# Patient Record
Sex: Female | Born: 1990 | Race: White | Hispanic: No | Marital: Single | Smoking: Current every day smoker
Health system: Southern US, Community
[De-identification: ages and names within clinical notes are randomized; demographics above are authoritative.]

## PROBLEM LIST (undated history)

## (undated) DIAGNOSIS — N390 Urinary tract infection, site not specified: Secondary | ICD-10-CM

## (undated) HISTORY — PX: OTHER SURGICAL HISTORY: SHX169

## (undated) HISTORY — PX: CYST REMOVAL HAND: SHX6279

## (undated) HISTORY — PX: TONSILLECTOMY: SUR1361

## (undated) HISTORY — PX: TYMPANOSTOMY TUBE PLACEMENT: SHX32

---

## 2014-01-02 ENCOUNTER — Encounter (HOSPITAL_BASED_OUTPATIENT_CLINIC_OR_DEPARTMENT_OTHER): Payer: Self-pay | Admitting: Emergency Medicine

## 2014-01-02 ENCOUNTER — Emergency Department (HOSPITAL_BASED_OUTPATIENT_CLINIC_OR_DEPARTMENT_OTHER)
Admission: EM | Admit: 2014-01-02 | Discharge: 2014-01-03 | Disposition: A | Discharge status: Home / Self Care | Payer: Medicaid Other | Attending: Emergency Medicine | Admitting: Emergency Medicine

## 2014-01-02 DIAGNOSIS — K5900 Constipation, unspecified: Secondary | ICD-10-CM | POA: Insufficient documentation

## 2014-01-02 DIAGNOSIS — R142 Eructation: Secondary | ICD-10-CM

## 2014-01-02 DIAGNOSIS — Z79899 Other long term (current) drug therapy: Secondary | ICD-10-CM | POA: Insufficient documentation

## 2014-01-02 DIAGNOSIS — IMO0001 Reserved for inherently not codable concepts without codable children: Secondary | ICD-10-CM

## 2014-01-02 DIAGNOSIS — O99330 Smoking (tobacco) complicating pregnancy, unspecified trimester: Secondary | ICD-10-CM | POA: Insufficient documentation

## 2014-01-02 DIAGNOSIS — O2390 Unspecified genitourinary tract infection in pregnancy, unspecified trimester: Secondary | ICD-10-CM | POA: Insufficient documentation

## 2014-01-02 DIAGNOSIS — R141 Gas pain: Secondary | ICD-10-CM | POA: Insufficient documentation

## 2014-01-02 DIAGNOSIS — N390 Urinary tract infection, site not specified: Secondary | ICD-10-CM | POA: Insufficient documentation

## 2014-01-02 DIAGNOSIS — R143 Flatulence: Secondary | ICD-10-CM

## 2014-01-02 NOTE — ED Notes (Signed)
Pt reports that she developed mid abdominal pain from umbilical pain down yesterday, sharp shooting pains that come and go, pmh of constipation

## 2014-01-03 ENCOUNTER — Encounter (HOSPITAL_BASED_OUTPATIENT_CLINIC_OR_DEPARTMENT_OTHER): Payer: Self-pay | Admitting: Emergency Medicine

## 2014-01-03 ENCOUNTER — Emergency Department (HOSPITAL_BASED_OUTPATIENT_CLINIC_OR_DEPARTMENT_OTHER): Payer: Medicaid Other

## 2014-01-03 LAB — URINALYSIS, ROUTINE W REFLEX MICROSCOPIC
Bilirubin Urine: NEGATIVE
GLUCOSE, UA: NEGATIVE mg/dL
Hgb urine dipstick: NEGATIVE
Ketones, ur: NEGATIVE mg/dL
Nitrite: NEGATIVE
PH: 5.5 (ref 5.0–8.0)
Protein, ur: NEGATIVE mg/dL
Specific Gravity, Urine: 1.028 (ref 1.005–1.030)
Urobilinogen, UA: 0.2 mg/dL (ref 0.0–1.0)

## 2014-01-03 LAB — URINE MICROSCOPIC-ADD ON

## 2014-01-03 MED ORDER — NITROFURANTOIN MONOHYD MACRO 100 MG PO CAPS
100.0000 mg | ORAL_CAPSULE | Freq: Once | ORAL | Status: AC
  Administered 2014-01-03: 100 mg via ORAL
  Filled 2014-01-03: qty 1

## 2014-01-03 MED ORDER — NITROFURANTOIN MONOHYD MACRO 100 MG PO CAPS
100.0000 mg | ORAL_CAPSULE | Freq: Two times a day (BID) | ORAL | Status: DC
Start: 2014-01-03 — End: 2014-06-10

## 2014-01-03 NOTE — ED Notes (Signed)
Patient transported to Ultrasound 

## 2014-01-03 NOTE — ED Notes (Signed)
abd pain w nausea x 2 days  Denies vag dc

## 2014-01-03 NOTE — ED Provider Notes (Signed)
CSN: [id number]     Arrival date & time 01/02/14  2341 History   First MD Initiated Contact with Patient 01/03/14 0030     Chief Complaint  Patient presents with  . Abdominal Pain     (Consider location/radiation/quality/duration/timing/severity/associated sxs/prior Treatment) Patient is a 23 y.o. female presenting with abdominal pain. The history is provided by the patient.  Abdominal Pain Pain location:  Generalized Pain quality: cramping   Pain radiates to:  Does not radiate Pain severity:  Moderate Onset quality:  Gradual Timing:  Constant Progression:  Unchanged Chronicity:  Recurrent Context: not alcohol use   Relieved by:  Nothing Worsened by:  Nothing tried Ineffective treatments:  None tried Associated symptoms: constipation and dysuria   Associated symptoms: no diarrhea, no fever, no nausea, no vaginal bleeding, no vaginal discharge and no vomiting   Risk factors: pregnancy     History reviewed. No pertinent past medical history. History reviewed. No pertinent past surgical history. History reviewed. No pertinent family history. History  Substance Use Topics  . Smoking status: Current Every Day Smoker -- 0.50 packs/day    Types: Cigarettes  . Smokeless tobacco: Not on file  . Alcohol Use: No   OB History   Grav Para Term Preterm Abortions TAB SAB Ect Mult Living   1              Review of Systems  Constitutional: Negative for fever.  Gastrointestinal: Positive for abdominal pain and constipation. Negative for nausea, vomiting and diarrhea.  Genitourinary: Positive for dysuria. Negative for flank pain, vaginal bleeding and vaginal discharge.  All other systems reviewed and are negative.     Allergies  Review of patient's allergies indicates no known allergies.  Home Medications   Prior to Admission medications   Medication Sig Start Date End Date Taking? Authorizing Provider  Prenatal Vit-Fe Fumarate-FA (PRENATAL MULTIVITAMIN) TABS tablet Take 1  tablet by mouth daily at 12 noon.   Yes Historical Provider, MD  nitrofurantoin, macrocrystal-monohydrate, (MACROBID) 100 MG capsule Take 1 capsule (100 mg total) by mouth 2 (two) times daily. X 7 days 01/03/14   April K Palumbo-Rasch, MD   BP 114/85  Pulse 82  Temp(Src) 97.9 F (36.6 C) (Oral)  Resp 18  Ht 5\' 5"  (1.651 m)  Wt 117 lb (53.071 kg)  BMI 19.47 kg/m2  SpO2 100%  LMP 11/14/2013 Physical Exam  Constitutional: She is oriented to person, place, and time. She appears well-developed and well-nourished. No distress.  HENT:  Head: Normocephalic and atraumatic.  Mouth/Throat: Oropharynx is clear and moist.  Eyes: Conjunctivae are normal. Pupils are equal, round, and reactive to light.  Neck: Normal range of motion. Neck supple.  Cardiovascular: Normal rate, regular rhythm and intact distal pulses.   Pulmonary/Chest: Effort normal. She has no wheezes. She has no rales.  Abdominal: Soft. Bowel sounds are increased. There is no tenderness. There is no rebound and no guarding.  Gas and patient points to spots where gas is heard as source of pain  Musculoskeletal: Normal range of motion.  Neurological: She is alert and oriented to person, place, and time.  Skin: Skin is warm and dry.  Psychiatric: She has a normal mood and affect.    ED Course  Procedures (including critical care time) Labs Review Labs Reviewed  URINALYSIS, ROUTINE W REFLEX MICROSCOPIC - Abnormal; Notable for the following:    APPearance CLOUDY (*)    Leukocytes, UA SMALL (*)    All other components within normal limits  URINE MICROSCOPIC-ADD ON - Abnormal; Notable for the following:    Squamous Epithelial / LPF FEW (*)    Bacteria, UA FEW (*)    All other components within normal limits    Imaging Review [place] Ob Comp Less 14 Wks  01/03/2014   CLINICAL DATA:  Pelvic pain and nausea with positive pregnancy test  EXAM: OBSTETRIC <14 WK US AND TRANSVAGINAL OB US  TECHNIQUE: Both transabdominal and transvaginal  ultrasound examinations were performed for complete evaluation of the gestation as well as the maternal uterus, adnexal regions, and pelvic cul-de-sac. Transvaginal technique was performed to assess early pregnancy.  COMPARISON:  None.  FINDINGS: Intrauterine gestational sac: Visualized/normal in shape.  Yolk sac:  Present  Embryo:  Present  Cardiac Activity: Present  Heart Rate:  123 bpm  CRL:   7.2  mm   6 w 4 d                  US EDC: 08/25/2014  Maternal uterus/adnexae: No definitive subchorionic hematoma. When accounting for a corpus luteum on the right, the ovaries are symmetric in size and unremarkable in appearance. No free pelvic fluid or adnexal mass.  IMPRESSION: Single living intrauterine gestation, sonographic age 58 weeks 4 days. No adverse fetal or maternal findings.   Electronically Signed   By: Tiburcio PeaJonathan  Watts M.D.   On: 01/03/2014 00:37   [place] Ob Transvaginal  01/03/2014   CLINICAL DATA:  Pelvic pain and nausea with positive pregnancy test  EXAM: OBSTETRIC <14 WK US AND TRANSVAGINAL OB US  TECHNIQUE: Both transabdominal and transvaginal ultrasound examinations were performed for complete evaluation of the gestation as well as the maternal uterus, adnexal regions, and pelvic cul-de-sac. Transvaginal technique was performed to assess early pregnancy.  COMPARISON:  None.  FINDINGS: Intrauterine gestational sac: Visualized/normal in shape.  Yolk sac:  Present  Embryo:  Present  Cardiac Activity: Present  Heart Rate:  123 bpm  CRL:   7.2  mm   6 w 4 d                  US EDC: 08/25/2014  Maternal uterus/adnexae: No definitive subchorionic hematoma. When accounting for a corpus luteum on the right, the ovaries are symmetric in size and unremarkable in appearance. No free pelvic fluid or adnexal mass.  IMPRESSION: Single living intrauterine gestation, sonographic age 58 weeks 4 days. No adverse fetal or maternal findings.   Electronically Signed   By: Tiburcio PeaJonathan  Watts M.D.   On: 01/03/2014 00:37     EKG  Interpretation None      MDM   Final diagnoses:  UTI (lower urinary tract infection)  Gas    Will treat for UTI.  Call your OB to see if gas ex is on the approved pregnancy list.  Recommend high fiber low grease diet for cramping.  No indication for advanced imaging exam and vitals are benign and reassuring     April K Palumbo-Rasch, MD 01/03/14 [id number]

## 2014-05-12 ENCOUNTER — Encounter (HOSPITAL_BASED_OUTPATIENT_CLINIC_OR_DEPARTMENT_OTHER): Payer: Self-pay | Admitting: Emergency Medicine

## 2014-05-13 ENCOUNTER — Emergency Department (HOSPITAL_BASED_OUTPATIENT_CLINIC_OR_DEPARTMENT_OTHER)
Admission: EM | Admit: 2014-05-13 | Discharge: 2014-05-13 | Disposition: A | Discharge status: Home / Self Care | Payer: Medicaid Other | Attending: Emergency Medicine | Admitting: Emergency Medicine

## 2014-05-13 ENCOUNTER — Encounter (HOSPITAL_BASED_OUTPATIENT_CLINIC_OR_DEPARTMENT_OTHER): Payer: Self-pay

## 2014-05-13 DIAGNOSIS — Y9389 Activity, other specified: Secondary | ICD-10-CM | POA: Diagnosis not present

## 2014-05-13 DIAGNOSIS — M545 Low back pain: Secondary | ICD-10-CM | POA: Diagnosis present

## 2014-05-13 DIAGNOSIS — Z720 Tobacco use: Secondary | ICD-10-CM | POA: Diagnosis not present

## 2014-05-13 DIAGNOSIS — Z791 Long term (current) use of non-steroidal anti-inflammatories (NSAID): Secondary | ICD-10-CM | POA: Diagnosis not present

## 2014-05-13 DIAGNOSIS — Z79899 Other long term (current) drug therapy: Secondary | ICD-10-CM | POA: Insufficient documentation

## 2014-05-13 DIAGNOSIS — S39012A Strain of muscle, fascia and tendon of lower back, initial encounter: Secondary | ICD-10-CM | POA: Insufficient documentation

## 2014-05-13 DIAGNOSIS — Y92410 Unspecified street and highway as the place of occurrence of the external cause: Secondary | ICD-10-CM | POA: Insufficient documentation

## 2014-05-13 MED ORDER — NAPROXEN 500 MG PO TABS: 500.0000 mg | ORAL_TABLET | Freq: Two times a day (BID) | ORAL | Status: DC

## 2014-05-13 MED ORDER — CYCLOBENZAPRINE HCL 10 MG PO TABS: 10.0000 mg | ORAL_TABLET | Freq: Two times a day (BID) | ORAL | Status: DC | PRN

## 2014-05-13 MED ORDER — OXYCODONE-ACETAMINOPHEN 5-325 MG PO TABS: 1.0000 | ORAL_TABLET | Freq: Four times a day (QID) | ORAL | Status: DC | PRN
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
## 2014-06-10 ENCOUNTER — Emergency Department (HOSPITAL_BASED_OUTPATIENT_CLINIC_OR_DEPARTMENT_OTHER)
Admission: EM | Admit: 2014-06-10 | Discharge: 2014-06-10 | Disposition: A | Discharge status: Home / Self Care | Payer: Medicaid Other | Attending: Emergency Medicine | Admitting: Emergency Medicine

## 2014-06-10 ENCOUNTER — Encounter (HOSPITAL_BASED_OUTPATIENT_CLINIC_OR_DEPARTMENT_OTHER): Payer: Self-pay

## 2014-06-10 DIAGNOSIS — R300 Dysuria: Secondary | ICD-10-CM

## 2014-06-10 DIAGNOSIS — N760 Acute vaginitis: Secondary | ICD-10-CM | POA: Diagnosis not present

## 2014-06-10 DIAGNOSIS — Z3202 Encounter for pregnancy test, result negative: Secondary | ICD-10-CM | POA: Insufficient documentation

## 2014-06-10 DIAGNOSIS — Z720 Tobacco use: Secondary | ICD-10-CM | POA: Insufficient documentation

## 2014-06-10 DIAGNOSIS — Z792 Long term (current) use of antibiotics: Secondary | ICD-10-CM | POA: Insufficient documentation

## 2014-06-10 DIAGNOSIS — N390 Urinary tract infection, site not specified: Secondary | ICD-10-CM | POA: Insufficient documentation

## 2014-06-10 DIAGNOSIS — B9689 Other specified bacterial agents as the cause of diseases classified elsewhere: Secondary | ICD-10-CM

## 2014-06-10 LAB — URINALYSIS, ROUTINE W REFLEX MICROSCOPIC
BILIRUBIN URINE: NEGATIVE
Glucose, UA: NEGATIVE mg/dL
Hgb urine dipstick: NEGATIVE
KETONES UR: NEGATIVE mg/dL
NITRITE: POSITIVE — AB
PH: 6 (ref 5.0–8.0)
PROTEIN: NEGATIVE mg/dL
Specific Gravity, Urine: 1.016 (ref 1.005–1.030)
Urobilinogen, UA: 0.2 mg/dL (ref 0.0–1.0)

## 2014-06-10 LAB — WET PREP, GENITAL
Trich, Wet Prep: NONE SEEN
YEAST WET PREP: NONE SEEN

## 2014-06-10 LAB — URINE MICROSCOPIC-ADD ON

## 2014-06-10 LAB — PREGNANCY, URINE: Preg Test, Ur: NEGATIVE

## 2014-06-10 MED ORDER — METRONIDAZOLE 500 MG PO TABS: 500.0000 mg | ORAL_TABLET | Freq: Two times a day (BID) | ORAL | Status: DC

## 2014-06-10 MED ORDER — CEPHALEXIN 500 MG PO CAPS: 500.0000 mg | ORAL_CAPSULE | Freq: Four times a day (QID) | ORAL | Status: DC

## 2014-06-10 NOTE — ED Notes (Signed)
C/o dysuria and vaginal d/c x 3 days

## 2014-06-10 NOTE — Discharge Instructions (Signed)
1. Medications: Keflex, Flagyl usual home medications 2. Treatment: rest, drink plenty of fluids, take medications as prescribed 3. Follow Up: Please followup with your primary doctor in 3 days for discussion of your diagnoses and further evaluation after today's visit; if you do not have a primary care doctor use the resource guide provided to find one; return to the ER for fevers, persistent vomiting, worsening abdominal pain or other concerning symptoms.  Bacterial Vaginosis Bacterial vaginosis is a vaginal infection that occurs when the normal balance of bacteria in the vagina is disrupted. It results from an overgrowth of certain bacteria. This is the most common vaginal infection in women of childbearing age. Treatment is important to prevent complications, especially in pregnant women, as it can cause a premature delivery. CAUSES  Bacterial vaginosis is caused by an increase in harmful bacteria that are normally present in smaller amounts in the vagina. Several different kinds of bacteria can cause bacterial vaginosis. However, the reason that the condition develops is not fully understood. RISK FACTORS Certain activities or behaviors can put you at an increased risk of developing bacterial vaginosis, including:  Having a new sex partner or multiple sex partners.  Douching.  Using an intrauterine device (IUD) for contraception. Women do not get bacterial vaginosis from toilet seats, bedding, swimming pools, or contact with objects around them. SIGNS AND SYMPTOMS  Some women with bacterial vaginosis have no signs or symptoms. Common symptoms include:  Grey vaginal discharge.  A fishlike odor with discharge, especially after sexual intercourse.  Itching or burning of the vagina and vulva.  Burning or pain with urination. DIAGNOSIS  Your health care provider will take a medical history and examine the vagina for signs of bacterial vaginosis. A sample of vaginal fluid may be taken.  Your health care provider will look at this sample under a microscope to check for bacteria and abnormal cells. A vaginal pH test may also be done.  TREATMENT  Bacterial vaginosis may be treated with antibiotic medicines. These may be given in the form of a pill or a vaginal cream. A second round of antibiotics may be prescribed if the condition comes back after treatment.  HOME CARE INSTRUCTIONS   Only take over-the-counter or prescription medicines as directed by your health care provider.  If antibiotic medicine was prescribed, take it as directed. Make sure you finish it even if you start to feel better.  Do not have sex until treatment is completed.  Tell all sexual partners that you have a vaginal infection. They should see their health care provider and be treated if they have problems, such as a mild rash or itching.  Practice safe sex by using condoms and only having one sex partner. SEEK MEDICAL CARE IF:   Your symptoms are not improving after 3 days of treatment.  You have increased discharge or pain.  You have a fever. MAKE SURE YOU:   Understand these instructions.  Will watch your condition.  Will get help right away if you are not doing well or get worse. FOR MORE INFORMATION  Centers for Disease Control and Prevention, Division of STD Prevention: SolutionApps.co.zawww.cdc.gov/std American Sexual Health Association (ASHA): www.ashastd.org  Document Released: 06/26/2005 Document Revised: 04/16/2013 Document Reviewed: 02/05/2013 [facility] Patient Information 2015 [place]. This information is not intended to replace advice given to you by your health care provider. Make sure you discuss any questions you have with your health care provider.

## 2014-06-10 NOTE — ED Provider Notes (Signed)
CSN: [id number]     Arrival date & time 06/10/14  1323 History   First MD Initiated Contact with Patient 06/10/14 1523     Chief Complaint  Patient presents with  . Dysuria     (Consider location/radiation/quality/duration/timing/severity/associated sxs/prior Treatment) Patient is a 23 y.o. female presenting with dysuria. The history is provided by the patient and medical records. No language interpreter was used.  Dysuria Associated symptoms: no abdominal pain, no fever, no nausea and no vomiting      Maureen GalasSamantha Dirden is a 23 y.o. female  with no major medical Hx presents to the Emergency Department complaining of gradual, persistent, progressively worsening dysuria onset 3 days. Associated symptoms include frequency, urgency and dysuria.  Pt also with increased vaginal discharge without itching or odor.  Pt is taking Azo with moderate relief and reports that this sometimes give her vaginal discharge.  Pt reports she is sexually active but without new partners.  Pt reports Hx of UTI with similar symptoms.  Nothing makes the symptoms worse.  Pt denies fever, chills, headache, neck pain, chest pain, SOB, abd pain, N/V/D, low back pain.  Pt reports she is changing OCPs and therefore her menstrual cycle was longer than usual.  LMP:06-03-14     History reviewed. No pertinent past medical history. Past Surgical History  Procedure Laterality Date  . Cyst removal hand     No family history on file. History  Substance Use Topics  . Smoking status: Current Every Day Smoker -- 0.50 packs/day    Types: Cigarettes  . Smokeless tobacco: Not on file  . Alcohol Use: Yes   OB History    Gravida Para Term Preterm AB TAB SAB Ectopic Multiple Living   1              Review of Systems  Constitutional: Negative for fever, diaphoresis, appetite change, fatigue and unexpected weight change.  HENT: Negative for mouth sores.   Eyes: Negative for visual disturbance.  Respiratory: Negative for cough,  chest tightness, shortness of breath and wheezing.   Cardiovascular: Negative for chest pain.  Gastrointestinal: Negative for nausea, vomiting, abdominal pain, diarrhea and constipation.  Endocrine: Negative for polydipsia, polyphagia and polyuria.  Genitourinary: Positive for dysuria, urgency and frequency. Negative for hematuria.  Musculoskeletal: Negative for back pain and neck stiffness.  Skin: Negative for rash.  Allergic/Immunologic: Negative for immunocompromised state.  Neurological: Negative for syncope, light-headedness and headaches.  Hematological: Does not bruise/bleed easily.  Psychiatric/Behavioral: Negative for sleep disturbance. The patient is not nervous/anxious.       Allergies  Review of patient's allergies indicates no known allergies.  Home Medications   Prior to Admission medications   Medication Sig Start Date End Date Taking? Authorizing Provider  cephALEXin (KEFLEX) 500 MG capsule Take 1 capsule (500 mg total) by mouth 4 (four) times daily. 06/10/14   Mairany Bruno, PA-C  metroNIDAZOLE (FLAGYL) 500 MG tablet Take 1 tablet (500 mg total) by mouth 2 (two) times daily. One po bid x 7 days 06/10/14   Dahlia ClientHannah Garyn Waguespack, PA-C   BP 111/73 mmHg  Pulse 93  Temp(Src) 98.3 F (36.8 C)  Resp 18  Ht 5\' 5"  (1.651 m)  Wt 120 lb (54.432 kg)  BMI 19.97 kg/m2  SpO2 100%  LMP 05/30/2014 (Approximate) Physical Exam  Constitutional: She appears well-developed and well-nourished. No distress.  Awake, alert, nontoxic appearance  HENT:  Head: Normocephalic and atraumatic.  Mouth/Throat: Oropharynx is clear and moist. No oropharyngeal exudate.  Eyes: Conjunctivae  are normal. No scleral icterus.  Neck: Normal range of motion. Neck supple.  Cardiovascular: Normal rate, regular rhythm, normal heart sounds and intact distal pulses.   No murmur heard. Pulmonary/Chest: Effort normal and breath sounds normal. No respiratory distress. She has no wheezes.  Equal chest  expansion  Abdominal: Soft. Bowel sounds are normal. She exhibits no distension and no mass. There is no tenderness. There is no rebound and no guarding. Hernia confirmed negative in the right inguinal area and confirmed negative in the left inguinal area.  Genitourinary: Uterus normal. No labial fusion. There is no rash, tenderness or lesion on the right labia. There is no rash, tenderness or lesion on the left labia. Uterus is not deviated, not enlarged, not fixed and not tender. Cervix exhibits no motion tenderness, no discharge and no friability. Right adnexum displays no mass, no tenderness and no fullness. Left adnexum displays no mass, no tenderness and no fullness. No erythema, tenderness or bleeding in the vagina. No foreign body around the vagina. No signs of injury around the vagina. Vaginal discharge (thick, yellow) found.  Musculoskeletal: Normal range of motion. She exhibits no edema.  Lymphadenopathy:       Right: No inguinal adenopathy present.       Left: No inguinal adenopathy present.  Neurological: She is alert.  Speech is clear and goal oriented Moves extremities without ataxia  Skin: Skin is warm and dry. She is not diaphoretic. No erythema.  Psychiatric: She has a normal mood and affect.  Nursing note and vitals reviewed.   ED Course  Procedures (including critical care time) Labs Review Labs Reviewed  WET PREP, GENITAL - Abnormal; Notable for the following:    Clue Cells Wet Prep HPF POC MODERATE (*)    WBC, Wet Prep HPF POC MODERATE (*)    All other components within normal limits  URINALYSIS, ROUTINE W REFLEX MICROSCOPIC - Abnormal; Notable for the following:    Color, Urine AMBER (*)    APPearance CLOUDY (*)    Nitrite POSITIVE (*)    Leukocytes, UA MODERATE (*)    All other components within normal limits  URINE MICROSCOPIC-ADD ON - Abnormal; Notable for the following:    Bacteria, UA FEW (*)    All other components within normal limits  GC/CHLAMYDIA PROBE  AMP  PREGNANCY, URINE    Imaging Review No results found.   EKG Interpretation None      MDM   Final diagnoses:  UTI (lower urinary tract infection)  BV (bacterial vaginosis)  Dysuria   Maureen Dawson presents with dysuria and vaginal discharge for 3 days.  Pt has been diagnosed with a UTI. Pt is afebrile, no CVA tenderness, normotensive, and denies N/V. Pt to be dc home with antibiotics and instructions to follow up with PCP if symptoms persist.  Patient with moderate clue cells and moderate white blood cells. She does not believe that she is at risk for an STD. Will treat BV with Flagyl and urinary tract infection. Patient knows that she has gonorrhea and Chlamydia pending and we will contact her with the results.  I have personally reviewed patient's vitals, nursing note and any pertinent labs or imaging.  I performed an undressed physical exam.    It has been determined that no acute conditions requiring further emergency intervention are present at this time. The patient/guardian have been advised of the diagnosis and plan. I reviewed all labs and imaging including any potential incidental findings. We have discussed signs and  symptoms that warrant return to the ED and they are listed in the discharge instructions.    Vital signs are stable at discharge.   BP 111/73 mmHg  Pulse 93  Temp(Src) 98.3 F (36.8 C)  Resp 18  Ht 5\' 5"  (1.651 m)  Wt 120 lb (54.432 kg)  BMI 19.97 kg/m2  SpO2 100%  LMP 05/30/2014 (Approximate)         [facility] Jurrell Royster, PA-C 06/10/14 1711  Geoffery Lyonsouglas Delo, MD 06/10/14 2248

## 2014-06-11 LAB — GC/CHLAMYDIA PROBE AMP
CT Probe RNA: POSITIVE — AB
GC Probe RNA: NEGATIVE

## 2014-06-12 ENCOUNTER — Telehealth (HOSPITAL_BASED_OUTPATIENT_CLINIC_OR_DEPARTMENT_OTHER): Payer: Self-pay

## 2014-06-12 NOTE — Telephone Encounter (Signed)
Positive for chlamydia- chart sent to EDP office for review. 

## 2014-06-14 ENCOUNTER — Telehealth: Payer: Self-pay | Admitting: Emergency Medicine

## 2014-06-14 NOTE — Telephone Encounter (Signed)
Post ED Visit - Positive Culture Follow-up: Successful Patient Follow-Up  Positive Chlamydia culture  [x]  Patient discharged without antimicrobial prescription and treatment is now indicated []  Organism is resistant to prescribed ED discharge antimicrobial []  Patient with positive blood cultures  Changes discussed with ED provider: Linwood DibblesJon Knapp, MD New antibiotic prescription:  Zithromax 1,000 mg PO once Called to [facility] [phone]  Contacted patient, date 06/14/14, time 1638 pt returned call, ID verified. pt notified of positive Chlamydia and need for treatment. STD instructions provided, patient verbalized understanding. RX Zithormax called to [facility] voicemail [phone]   Maureen HaroldGammons, Maureen Dawson 06/14/2014, 4:56 PM

## 2014-08-01 ENCOUNTER — Emergency Department (HOSPITAL_COMMUNITY)
Admission: EM | Admit: 2014-08-01 | Discharge: 2014-08-01 | Disposition: A | Discharge status: Home / Self Care | Payer: Medicaid Other | Attending: Emergency Medicine | Admitting: Emergency Medicine

## 2014-08-01 ENCOUNTER — Encounter (HOSPITAL_COMMUNITY): Payer: Self-pay | Admitting: Emergency Medicine

## 2014-08-01 DIAGNOSIS — Z792 Long term (current) use of antibiotics: Secondary | ICD-10-CM | POA: Insufficient documentation

## 2014-08-01 DIAGNOSIS — Z720 Tobacco use: Secondary | ICD-10-CM | POA: Diagnosis not present

## 2014-08-01 DIAGNOSIS — R197 Diarrhea, unspecified: Secondary | ICD-10-CM | POA: Diagnosis not present

## 2014-08-01 DIAGNOSIS — R1010 Upper abdominal pain, unspecified: Secondary | ICD-10-CM | POA: Diagnosis present

## 2014-08-01 DIAGNOSIS — R112 Nausea with vomiting, unspecified: Secondary | ICD-10-CM

## 2014-08-01 DIAGNOSIS — N390 Urinary tract infection, site not specified: Secondary | ICD-10-CM

## 2014-08-01 HISTORY — DX: Urinary tract infection, site not specified: N39.0

## 2014-08-01 LAB — URINE MICROSCOPIC-ADD ON

## 2014-08-01 LAB — COMPREHENSIVE METABOLIC PANEL
ALT: 10 U/L (ref 0–35)
AST: 17 U/L (ref 0–37)
Albumin: 4 g/dL (ref 3.5–5.2)
Alkaline Phosphatase: 54 U/L (ref 39–117)
Anion gap: 6 (ref 5–15)
BUN: 10 mg/dL (ref 6–23)
CALCIUM: 9 mg/dL (ref 8.4–10.5)
CO2: 27 mmol/L (ref 19–32)
CREATININE: 0.95 mg/dL (ref 0.50–1.10)
Chloride: 103 mmol/L (ref 96–112)
GFR calc Af Amer: >90 mL/min (ref >90)
GFR calc non Af Amer: 85 mL/min — ABNORMAL LOW (ref >90)
Glucose, Bld: 97 mg/dL (ref 70–99)
Potassium: 3.8 mmol/L (ref 3.5–5.1)
Sodium: 136 mmol/L (ref 135–145)
Total Bilirubin: 0.8 mg/dL (ref 0.3–1.2)
Total Protein: 6.9 g/dL (ref 6.0–8.3)

## 2014-08-01 LAB — URINALYSIS, ROUTINE W REFLEX MICROSCOPIC
Bilirubin Urine: NEGATIVE
GLUCOSE, UA: NEGATIVE mg/dL
HGB URINE DIPSTICK: NEGATIVE
KETONES UR: NEGATIVE mg/dL
Nitrite: POSITIVE — AB
PROTEIN: 30 mg/dL — AB
Specific Gravity, Urine: 1.024 (ref 1.005–1.030)
UROBILINOGEN UA: 1 mg/dL (ref 0.0–1.0)
pH: 7.5 (ref 5.0–8.0)

## 2014-08-01 LAB — CBC WITH DIFFERENTIAL/PLATELET
Basophils Absolute: 0 10*3/uL (ref 0.0–0.1)
Basophils Relative: 1 % (ref 0–1)
EOS ABS: 0.1 10*3/uL (ref 0.0–0.7)
EOS PCT: 2 % (ref 0–5)
HEMATOCRIT: 41.4 % (ref 36.0–46.0)
Hemoglobin: 13.6 g/dL (ref 12.0–15.0)
LYMPHS ABS: 0.9 10*3/uL (ref 0.7–4.0)
Lymphocytes Relative: 21 % (ref 12–46)
MCH: 29.2 pg (ref 26.0–34.0)
MCHC: 32.9 g/dL (ref 30.0–36.0)
MCV: 89 fL (ref 78.0–100.0)
Monocytes Absolute: 0.5 10*3/uL (ref 0.1–1.0)
Monocytes Relative: 12 % (ref 3–12)
Neutro Abs: 2.7 10*3/uL (ref 1.7–7.7)
Neutrophils Relative %: 64 % (ref 43–77)
Platelets: 198 10*3/uL (ref 150–400)
RBC: 4.65 MIL/uL (ref 3.87–5.11)
RDW: 13.5 % (ref 11.5–15.5)
WBC: 4.1 10*3/uL (ref 4.0–10.5)

## 2014-08-01 LAB — LIPASE, BLOOD: LIPASE: 12 U/L (ref 11–59)

## 2014-08-01 MED ORDER — ONDANSETRON HCL 4 MG/2ML IJ SOLN
4.0000 mg | Freq: Once | INTRAMUSCULAR | Status: AC
  Administered 2014-08-01: 4 mg via INTRAVENOUS
  Filled 2014-08-01: qty 2

## 2014-08-01 MED ORDER — GI COCKTAIL ~~LOC~~
30.0000 mL | Freq: Once | ORAL | Status: AC
  Administered 2014-08-01: 30 mL via ORAL
  Filled 2014-08-01: qty 30

## 2014-08-01 MED ORDER — CEPHALEXIN 500 MG PO CAPS: 500.0000 mg | ORAL_CAPSULE | Freq: Four times a day (QID) | ORAL | Status: DC

## 2014-08-01 MED ORDER — SODIUM CHLORIDE 0.9 % IV BOLUS (SEPSIS)
1000.0000 mL | Freq: Once | INTRAVENOUS | Status: AC
  Administered 2014-08-01: 1000 mL via INTRAVENOUS

## 2014-08-01 MED ORDER — DEXTROSE 5 % IV SOLN
1.0000 g | Freq: Once | INTRAVENOUS | Status: AC
  Administered 2014-08-01: 1 g via INTRAVENOUS
  Filled 2014-08-01: qty 10

## 2014-08-01 NOTE — ED Notes (Signed)
pelic is setup and ready

## 2014-08-01 NOTE — Discharge Instructions (Signed)
Abdominal Pain °Many things can cause abdominal pain. Usually, abdominal pain is not caused by a disease and will improve without treatment. It can often be observed and treated at home. Your health care provider will do a physical exam and possibly order blood tests and X-rays to help determine the seriousness of your pain. However, in many cases, more time must pass before a clear cause of the pain can be found. Before that point, your health care provider may not know if you need more testing or further treatment. °HOME CARE INSTRUCTIONS  °Monitor your abdominal pain for any changes. The following actions may help to alleviate any discomfort you are experiencing: °· Only take over-the-counter or prescription medicines as directed by your health care provider. °· Do not take laxatives unless directed to do so by your health care provider. °· Try a clear liquid diet (broth, tea, or water) as directed by your health care provider. Slowly move to a bland diet as tolerated. °SEEK MEDICAL CARE IF: °· You have unexplained abdominal pain. °· You have abdominal pain associated with nausea or diarrhea. °· You have pain when you urinate or have a bowel movement. °· You experience abdominal pain that wakes you in the night. °· You have abdominal pain that is worsened or improved by eating food. °· You have abdominal pain that is worsened with eating fatty foods. °· You have a fever. °SEEK IMMEDIATE MEDICAL CARE IF:  °· Your pain does not go away within 2 hours. °· You keep throwing up (vomiting). °· Your pain is felt only in portions of the abdomen, such as the right side or the left lower portion of the abdomen. °· You pass bloody or black tarry stools. °MAKE SURE YOU: °· Understand these instructions.   °· Will watch your condition.   °· Will get help right away if you are not doing well or get worse.   °Document Released: 04/05/2005 Document Revised: 07/01/2013 Document Reviewed: 03/05/2013 °ExitCare® Patient Information  ©2015 [employer]. This information is not intended to replace advice given to you by your health care provider. Make sure you discuss any questions you have with your health care provider. °Urinary Tract Infection °Urinary tract infections (UTIs) can develop anywhere along your urinary tract. Your urinary tract is your body's drainage system for removing wastes and extra water. Your urinary tract includes two kidneys, two ureters, a bladder, and a urethra. Your kidneys are a pair of bean-shaped organs. Each kidney is about the size of your fist. They are located below your ribs, one on each side of your spine. °CAUSES °Infections are caused by microbes, which are microscopic organisms, including fungi, viruses, and bacteria. These organisms are so small that they can only be seen through a microscope. Bacteria are the microbes that most commonly cause UTIs. °SYMPTOMS  °Symptoms of UTIs may vary by age and gender of the patient and by the location of the infection. Symptoms in young women typically include a frequent and intense urge to urinate and a painful, burning feeling in the bladder or urethra during urination. Older women and men are more likely to be tired, shaky, and weak and have muscle aches and abdominal pain. A fever may mean the infection is in your kidneys. Other symptoms of a kidney infection include pain in your back or sides below the ribs, nausea, and vomiting. °DIAGNOSIS °To diagnose a UTI, your caregiver will ask you about your symptoms. Your caregiver also will ask to provide a urine sample. The urine sample   will be tested for bacteria and white blood cells. White blood cells are made by your body to help fight infection. °TREATMENT  °Typically, UTIs can be treated with medication. Because most UTIs are caused by a bacterial infection, they usually can be treated with the use of antibiotics. The choice of antibiotic and length of treatment depend on your symptoms and the type of bacteria  causing your infection. °HOME CARE INSTRUCTIONS °· If you were prescribed antibiotics, take them exactly as your caregiver instructs you. Finish the medication even if you feel better after you have only taken some of the medication. °· Drink enough water and fluids to keep your urine clear or pale yellow. °· Avoid caffeine, tea, and carbonated beverages. They tend to irritate your bladder. °· Empty your bladder often. Avoid holding urine for long periods of time. °· Empty your bladder before and after sexual intercourse. °· After a bowel movement, women should cleanse from front to back. Use each tissue only once. °SEEK MEDICAL CARE IF:  °· You have back pain. °· You develop a fever. °· Your symptoms do not begin to resolve within 3 days. °SEEK IMMEDIATE MEDICAL CARE IF:  °· You have severe back pain or lower abdominal pain. °· You develop chills. °· You have nausea or vomiting. °· You have continued burning or discomfort with urination. °MAKE SURE YOU:  °· Understand these instructions. °· Will watch your condition. °· Will get help right away if you are not doing well or get worse. °Document Released: 04/05/2005 Document Revised: 12/26/2011 Document Reviewed: 08/04/2011 °ExitCare® Patient Information ©2015 [employer]. This information is not intended to replace advice given to you by your health care provider. Make sure you discuss any questions you have with your health care provider. ° °

## 2014-08-01 NOTE — ED Notes (Signed)
Made patient aware that we needed a urine sample and she stated that she is unable to give one at this time.

## 2014-08-01 NOTE — ED Notes (Signed)
Asked patient if they could give urine sample in triage, but denied being able to at this time.

## 2014-08-01 NOTE — ED Notes (Signed)
Bed: WA25 Expected date:  Expected time:  Means of arrival:  Comments: Hold for hall c 

## 2014-08-01 NOTE — ED Notes (Signed)
Bed: [facility] Expected date:  Expected time:  Means of arrival:  Comments: 

## 2014-08-01 NOTE — ED Notes (Signed)
Per pt, states abdominal pain and lower back pain and vomiting since yesterday-states dysuria for 2 days, thought it was bladder infection but did nothing

## 2014-08-01 NOTE — ED Notes (Signed)
Pt made aware of need for urine sample.  

## 2014-08-01 NOTE — ED Provider Notes (Signed)
CSN: [id number]     Arrival date & time 08/01/14  1302 History   First MD Initiated Contact with Patient 08/01/14 1347     Chief Complaint  Patient presents with  . Abdominal Pain     (Consider location/radiation/quality/duration/timing/severity/associated sxs/prior Treatment) HPI  24 year old female presents with abdominal pain, nausea, and vomiting that started yesterday. The pain is upper abdomen. She's vomited at least 4 times. No hematemesis. A couple days ago had diarrhea but has not had any stools since. No blood in her stools. Is also having low back pain with this. 3 days ago she had dysuria but states that it spontaneously resolved. Has not had any troubles urinating since. No hematuria. Had a past history of chlamydia but states that she never had abdominal pain with or symptoms similar to this. No fevers or chills. She's been unable to keep down fluids.  Past Medical History  Diagnosis Date  . UTI (lower urinary tract infection)    Past Surgical History  Procedure Laterality Date  . Cyst removal hand     No family history on file. History  Substance Use Topics  . Smoking status: Current Every Day Smoker -- 0.50 packs/day    Types: Cigarettes  . Smokeless tobacco: Not on file  . Alcohol Use: Yes   OB History    Gravida Para Term Preterm AB TAB SAB Ectopic Multiple Living   1              Review of Systems  Constitutional: Negative for fever.  Gastrointestinal: Positive for nausea, vomiting, abdominal pain and diarrhea. Negative for constipation and blood in stool.  Genitourinary: Positive for dysuria. Negative for hematuria, vaginal bleeding, vaginal discharge, vaginal pain and menstrual problem.  Musculoskeletal: Positive for back pain.  All other systems reviewed and are negative.     Allergies  Review of patient's allergies indicates no known allergies.  Home Medications   Prior to Admission medications   Medication Sig Start Date End Date Taking?  Authorizing Provider  ibuprofen (ADVIL,MOTRIN) 200 MG tablet Take 800 mg by mouth every 6 (six) hours as needed for headache or moderate pain.   Yes Historical Provider, MD  cephALEXin (KEFLEX) 500 MG capsule Take 1 capsule (500 mg total) by mouth 4 (four) times daily. Patient not taking: Reported on 08/01/2014 06/10/14   Dahlia Client Muthersbaugh, PA-C  metroNIDAZOLE (FLAGYL) 500 MG tablet Take 1 tablet (500 mg total) by mouth 2 (two) times daily. One po bid x 7 days Patient not taking: Reported on 08/01/2014 06/10/14   Dahlia Client Muthersbaugh, PA-C   BP 128/76 mmHg  Pulse 80  Temp(Src) 98.1 F (36.7 C) (Oral)  Resp 18  SpO2 99%  LMP 08/01/2014 Physical Exam  Constitutional: She is oriented to person, place, and time. She appears well-developed and well-nourished.  Patient sitting calmly, looking at phone, appears in no acute distress  HENT:  Head: Normocephalic and atraumatic.  Right Ear: External ear normal.  Left Ear: External ear normal.  Nose: Nose normal.  Eyes: Right eye exhibits no discharge. Left eye exhibits no discharge.  Cardiovascular: Normal rate, regular rhythm and normal heart sounds.   Pulmonary/Chest: Effort normal and breath sounds normal.  Abdominal: Soft. Normal appearance. She exhibits no distension. There is tenderness (mild) in the epigastric area.  Neurological: She is alert and oriented to person, place, and time.  Skin: Skin is warm and dry.  Nursing note and vitals reviewed.   ED Course  Procedures (including critical care time) Labs  Review Labs Reviewed  COMPREHENSIVE METABOLIC PANEL - Abnormal; Notable for the following:    GFR calc non Af Amer 85 (*)    All other components within normal limits  CBC WITH DIFFERENTIAL/PLATELET  LIPASE, BLOOD  URINALYSIS, ROUTINE W REFLEX MICROSCOPIC  POC URINE PREG, ED  GC/CHLAMYDIA PROBE AMP ([place])    Imaging Review No results found.   EKG Interpretation None      MDM   Final diagnoses:  Pain of upper  abdomen  Nausea and vomiting in adult    Patient has a benign exam. Has mild upper abdominal tenderness but no right lower quadrant or lower abdominal tenderness. No vaginal symptoms at this time. Given that she has dysuria we'll check urine and urine STI's. However at this time with no vaginal symptoms and no lower abdominal tenderness at all think she is acute pelvic exam. No acute imaging indicated at this time. She is very well appearing and continues to interact on her phone without any signs of distress. Care transferred to Dr. Juleen ChinaKohut, will follow up urine and dispo.    Audree CamelScott T Charley Lafrance, MD 08/01/14 [phone]

## 2014-08-04 LAB — URINE CULTURE: Colony Count: >=100000

## 2014-08-05 ENCOUNTER — Telehealth (HOSPITAL_BASED_OUTPATIENT_CLINIC_OR_DEPARTMENT_OTHER): Payer: Self-pay | Admitting: Emergency Medicine

## 2014-08-05 NOTE — Telephone Encounter (Signed)
Post ED Visit - Positive Culture Follow-up  Culture report reviewed by antimicrobial stewardship pharmacist: []  Wes Dulaney, Pharm.D., BCPS [x]  Celedonio MiyamotoJeremy Frens, [address] BCPS []  Georgina PillionElizabeth Martin, Pharm.D., BCPS []  BethanyMinh Pham, [address] BCPS, AAHIVP []  Estella HuskMichelle Turner, Pharm.D., BCPS, AAHIVP []  Elder CyphersLorie Poole, [address] BCPS  Positive urine culture Proteus Treated with cephalexin, organism sensitive to the same and no further patient follow-up is required at this time.  Berle MullMiller, Durwin Davisson 08/05/2014, 11:59 AM

## 2014-12-02 DIAGNOSIS — R197 Diarrhea, unspecified: Secondary | ICD-10-CM | POA: Diagnosis not present

## 2014-12-02 DIAGNOSIS — O2311 Infections of bladder in pregnancy, first trimester: Secondary | ICD-10-CM | POA: Insufficient documentation

## 2014-12-02 DIAGNOSIS — Z79899 Other long term (current) drug therapy: Secondary | ICD-10-CM | POA: Diagnosis not present

## 2014-12-02 DIAGNOSIS — Z3A10 10 weeks gestation of pregnancy: Secondary | ICD-10-CM | POA: Insufficient documentation

## 2014-12-02 DIAGNOSIS — Z9049 Acquired absence of other specified parts of digestive tract: Secondary | ICD-10-CM | POA: Diagnosis not present

## 2014-12-02 DIAGNOSIS — O9989 Other specified diseases and conditions complicating pregnancy, childbirth and the puerperium: Secondary | ICD-10-CM | POA: Diagnosis present

## 2014-12-03 ENCOUNTER — Emergency Department (HOSPITAL_BASED_OUTPATIENT_CLINIC_OR_DEPARTMENT_OTHER)
Admission: EM | Admit: 2014-12-03 | Discharge: 2014-12-03 | Disposition: A | Discharge status: Home / Self Care | Payer: Medicaid Other | Attending: Emergency Medicine | Admitting: Emergency Medicine

## 2014-12-03 ENCOUNTER — Encounter (HOSPITAL_BASED_OUTPATIENT_CLINIC_OR_DEPARTMENT_OTHER): Payer: Self-pay | Admitting: *Deleted

## 2014-12-03 DIAGNOSIS — Z3491 Encounter for supervision of normal pregnancy, unspecified, first trimester: Secondary | ICD-10-CM

## 2014-12-03 DIAGNOSIS — N3090 Cystitis, unspecified without hematuria: Secondary | ICD-10-CM

## 2014-12-03 DIAGNOSIS — R109 Unspecified abdominal pain: Secondary | ICD-10-CM

## 2014-12-03 LAB — WET PREP, GENITAL
TRICH WET PREP: NONE SEEN
YEAST WET PREP: NONE SEEN

## 2014-12-03 LAB — URINALYSIS, ROUTINE W REFLEX MICROSCOPIC
Glucose, UA: NEGATIVE mg/dL
HGB URINE DIPSTICK: NEGATIVE
KETONES UR: NEGATIVE mg/dL
Nitrite: NEGATIVE
PROTEIN: NEGATIVE mg/dL
Specific Gravity, Urine: 1.033 — ABNORMAL HIGH (ref 1.005–1.030)
Urobilinogen, UA: 1 mg/dL (ref 0.0–1.0)
pH: 5.5 (ref 5.0–8.0)

## 2014-12-03 LAB — URINE MICROSCOPIC-ADD ON

## 2014-12-03 LAB — PREGNANCY, URINE: PREG TEST UR: POSITIVE — AB

## 2014-12-03 MED ORDER — CEFTRIAXONE SODIUM 250 MG IJ SOLR
250.0000 mg | Freq: Once | INTRAMUSCULAR | Status: AC
  Administered 2014-12-03: 250 mg via INTRAMUSCULAR
  Filled 2014-12-03: qty 250

## 2014-12-03 MED ORDER — LIDOCAINE HCL (PF) 1 % IJ SOLN
INTRAMUSCULAR | Status: AC
  Administered 2014-12-03: 1 mL
  Filled 2014-12-03: qty 5

## 2014-12-03 MED ORDER — AZITHROMYCIN 250 MG PO TABS
1000.0000 mg | ORAL_TABLET | Freq: Once | ORAL | Status: AC
  Administered 2014-12-03: 1000 mg via ORAL
  Filled 2014-12-03: qty 4

## 2014-12-03 MED ORDER — NITROFURANTOIN MONOHYD MACRO 100 MG PO CAPS: 100.0000 mg | ORAL_CAPSULE | Freq: Two times a day (BID) | ORAL | Status: DC

## 2014-12-03 NOTE — ED Provider Notes (Signed)
CSN: [id number]     Arrival date & time 12/02/14  2353 History   First MD Initiated Contact with Patient 12/03/14 0101     Chief Complaint  Patient presents with  . Abdominal Pain     (Consider location/radiation/quality/duration/timing/severity/associated sxs/prior Treatment) Patient is a 24 y.o. female presenting with abdominal pain.  Abdominal Pain Pain location:  Suprapubic Pain quality: cramping   Pain radiates to:  Does not radiate Pain severity:  Moderate Onset quality:  Sudden Duration:  4 hours Timing:  Intermittent Progression:  Unchanged Chronicity:  New Context comment:  Currently pregnant Relieved by:  Nothing Worsened by:  Movement and palpation Ineffective treatments:  None tried Associated symptoms: diarrhea and nausea   Associated symptoms: no anorexia, no constipation, no fever and no vomiting     Past Medical History  Diagnosis Date  . UTI (lower urinary tract infection)    Past Surgical History  Procedure Laterality Date  . Cyst removal hand    . Addenoidectomy    . Tympanostomy tube placement     No family history on file. History  Substance Use Topics  . Smoking status: Never Smoker   . Smokeless tobacco: Not on file  . Alcohol Use: No   OB History    Gravida Para Term Preterm AB TAB SAB Ectopic Multiple Living   1              Review of Systems  Constitutional: Negative for fever.  Gastrointestinal: Positive for nausea, abdominal pain and diarrhea. Negative for vomiting, constipation and anorexia.  All other systems reviewed and are negative.     Allergies  Review of patient's allergies indicates no known allergies.  Home Medications   Prior to Admission medications   Medication Sig Start Date End Date Taking? Authorizing Provider  Prenatal Vit-Fe Fumarate-FA (PRENATAL MULTIVITAMIN) TABS tablet Take 1 tablet by mouth daily at 12 noon.   Yes Historical Provider, MD  cephALEXin (KEFLEX) 500 MG capsule Take 1 capsule (500 mg  total) by mouth 4 (four) times daily. Patient not taking: Reported on 08/01/2014 06/10/14   Dahlia ClientHannah Muthersbaugh, PA-C  cephALEXin (KEFLEX) 500 MG capsule Take 1 capsule (500 mg total) by mouth 4 (four) times daily. 08/01/14   Raeford RazorStephen Kohut, MD  ibuprofen (ADVIL,MOTRIN) 200 MG tablet Take 800 mg by mouth every 6 (six) hours as needed for headache or moderate pain.    Historical Provider, MD  metroNIDAZOLE (FLAGYL) 500 MG tablet Take 1 tablet (500 mg total) by mouth 2 (two) times daily. One po bid x 7 days Patient not taking: Reported on 08/01/2014 06/10/14   Dahlia ClientHannah Muthersbaugh, PA-C  nitrofurantoin, macrocrystal-monohydrate, (MACROBID) 100 MG capsule Take 1 capsule (100 mg total) by mouth 2 (two) times daily. X 7 days 12/03/14   Mirian MoMatthew Ballard Budney, MD   BP 106/58 mmHg  Pulse 82  Temp(Src) 98.3 F (36.8 C) (Oral)  Resp 18  Ht 5\' 5"  (1.651 m)  Wt 125 lb (56.7 kg)  BMI 20.80 kg/m2  SpO2 100%  LMP 09/10/2014 Physical Exam  Constitutional: She is oriented to person, place, and time. She appears well-developed and well-nourished.  HENT:  Head: Normocephalic and atraumatic.  Right Ear: External ear normal.  Left Ear: External ear normal.  Eyes: Conjunctivae and EOM are normal. Pupils are equal, round, and reactive to light.  Neck: Normal range of motion. Neck supple.  Cardiovascular: Normal rate, regular rhythm, normal heart sounds and intact distal pulses.   Pulmonary/Chest: Effort normal and breath sounds normal.  Abdominal: Soft. Bowel sounds are normal. There is tenderness (mild) in the suprapubic area.  Genitourinary: Cervix exhibits discharge (yellow). Cervix exhibits no motion tenderness and no friability. Right adnexum displays no tenderness. Left adnexum displays no tenderness.  Musculoskeletal: Normal range of motion.  Neurological: She is alert and oriented to person, place, and time.  Skin: Skin is warm and dry.  Vitals reviewed.   ED Course  Procedures (including critical care  time) Labs Review Labs Reviewed  WET PREP, GENITAL - Abnormal; Notable for the following:    Clue Cells Wet Prep HPF POC FEW (*)    WBC, Wet Prep HPF POC MODERATE (*)    All other components within normal limits  URINALYSIS, ROUTINE W REFLEX MICROSCOPIC (NOT AT [facility]) - Abnormal; Notable for the following:    Color, Urine AMBER (*)    APPearance CLOUDY (*)    Specific Gravity, Urine 1.033 (*)    Bilirubin Urine SMALL (*)    Leukocytes, UA SMALL (*)    All other components within normal limits  PREGNANCY, URINE - Abnormal; Notable for the following:    Preg Test, Ur POSITIVE (*)    All other components within normal limits  URINE MICROSCOPIC-ADD ON - Abnormal; Notable for the following:    Squamous Epithelial / LPF FEW (*)    Bacteria, UA FEW (*)    Crystals CA OXALATE CRYSTALS (*)    All other components within normal limits  GC/CHLAMYDIA PROBE AMP ([place]) NOT AT [facility]    Imaging Review No results found.   EKG Interpretation None     EMERGENCY DEPARTMENT [place] PREGNANCY "Study: Limited Ultrasound of the Pelvis for Pregnancy"  INDICATIONS:Pregnancy(required) and Pelvic pain Multiple views of the uterus and pelvic cavity were obtained in real-time with a multi-frequency probe.  APPROACH:Transabdominal   PERFORMED BY: Myself  IMAGES ARCHIVED?: Yes  LIMITATIONS: none  PREGNANCY FREE FLUID: Small  ADNEXAL FINDINGS:none  PREGNANCY FINDINGS: Fetal pole present and Fetal heart activity seen  INTERPRETATION: Viable intrauterine pregnancy  GESTATIONAL AGE, ESTIMATE:   FETAL HEART RATE:  flicker  CPT Codes:  95284-13 (transabdominal OB)  768-26-52 (transvaginal OB, Reduced level of service for incomplete exam)     MDM   Final diagnoses:  Abdominal cramping  Normal IUP (intrauterine pregnancy) on prenatal ultrasound, first trimester  Cystitis    24 y.o. female with pertinent PMH of current pregnancy G2P1 presents with abd cramping.  No definitive spotting  by history, but ? Dc.  Physical exam on arrival today as above with small amount of yellow dc, suprapubic tenderness.  No dysuria.  Treated empirically for cervicitis (rocephin/azithromycin) given dc and cramping.  Wu with bacteriuria, likely cystitis given symptoms.  Will treat with macrobid.  No systemic symptoms to indicate pyelonephritis.  DC home to fu with ob, pt asymptomatic at time of dc.    I have reviewed all laboratory and imaging studies if ordered as above  1. Abdominal cramping   2. Normal IUP (intrauterine pregnancy) on prenatal ultrasound, first trimester   3. Cystitis         Mirian Mo, MD 12/03/14 [phone]

## 2014-12-03 NOTE — ED Notes (Signed)
C/o abd pain x 3-4 hours some nausea and diarrhea

## 2014-12-03 NOTE — ED Notes (Addendum)
Pt sts she is 10 weeks preg and began having sharp abd pains at apprx. 9:00 pm. No bleeding or spotting. This is pt's second preg.

## 2014-12-03 NOTE — Discharge Instructions (Signed)

## 2014-12-04 LAB — GC/CHLAMYDIA PROBE AMP (~~LOC~~) NOT AT ARMC
Chlamydia: NEGATIVE
NEISSERIA GONORRHEA: NEGATIVE

## 2018-04-04 ENCOUNTER — Encounter (HOSPITAL_BASED_OUTPATIENT_CLINIC_OR_DEPARTMENT_OTHER): Payer: Self-pay | Admitting: Emergency Medicine

## 2018-04-04 ENCOUNTER — Other Ambulatory Visit: Payer: Self-pay

## 2018-04-04 ENCOUNTER — Emergency Department (HOSPITAL_BASED_OUTPATIENT_CLINIC_OR_DEPARTMENT_OTHER)
Admission: EM | Admit: 2018-04-04 | Discharge: 2018-04-04 | Disposition: A | Discharge status: Home / Self Care | Payer: 59 | Attending: Emergency Medicine | Admitting: Emergency Medicine

## 2018-04-04 DIAGNOSIS — F17210 Nicotine dependence, cigarettes, uncomplicated: Secondary | ICD-10-CM | POA: Diagnosis not present

## 2018-04-04 DIAGNOSIS — J0100 Acute maxillary sinusitis, unspecified: Secondary | ICD-10-CM | POA: Diagnosis not present

## 2018-04-04 DIAGNOSIS — R0981 Nasal congestion: Secondary | ICD-10-CM | POA: Diagnosis present

## 2018-04-04 MED ORDER — AMOXICILLIN-POT CLAVULANATE 875-125 MG PO TABS: 1.0000 | ORAL_TABLET | Freq: Two times a day (BID) | ORAL | 0 refills | Status: DC

## 2018-04-04 NOTE — Discharge Instructions (Signed)
Return if any problems.

## 2018-04-04 NOTE — ED Provider Notes (Signed)
[facility] EMERGENCY DEPARTMENT Provider Note   CSN: [id number] Arrival date & time: 04/04/18  1612     History   Chief Complaint Chief Complaint  Patient presents with  . Nasal Congestion    HPI Maureen Dawson is a 27 y.o. female.  The history is provided by the patient. No language interpreter was used.  Cough  This is a new problem. The current episode started 2 days ago. The problem occurs constantly. The problem has been gradually worsening. The cough is non-productive. There has been no fever. Associated symptoms include rhinorrhea. She has tried nothing for the symptoms. The treatment provided no relief. She is not a smoker. Her past medical history does not include asthma.  Pt complains of sinus congestion   Past Medical History:  Diagnosis Date  . UTI (lower urinary tract infection)     There are no active problems to display for this patient.   Past Surgical History:  Procedure Laterality Date  . addenoidectomy    . CYST REMOVAL HAND    . TYMPANOSTOMY TUBE PLACEMENT       OB History    Gravida  1   Para      Term      Preterm      AB      Living        SAB      TAB      Ectopic      Multiple      Live Births               Home Medications    Prior to Admission medications   Medication Sig Start Date End Date Taking? Authorizing Provider  amoxicillin-clavulanate (AUGMENTIN) 875-125 MG tablet Take 1 tablet by mouth every 12 (twelve) hours. 04/04/18   Elson Areas, PA-C  ibuprofen (ADVIL,MOTRIN) 200 MG tablet Take 800 mg by mouth every 6 (six) hours as needed for headache or moderate pain.    [provider]    Family History No family history on file.  Social History Social History   Tobacco Use  . Smoking status: Current Every Day Smoker    Packs/day: 0.50    Types: Cigarettes  . Smokeless tobacco: Never Used  Substance Use Topics  . Alcohol use: Yes  . Drug use: No     Allergies   Patient  has no known allergies.   Review of Systems Review of Systems  HENT: Positive for rhinorrhea.   Respiratory: Positive for cough.   All other systems reviewed and are negative.    Physical Exam Updated Vital Signs BP (!) 143/92 (BP Location: Left Arm)   Pulse 98   Temp 98.2 F (36.8 C) (Oral)   Resp 20   Ht 5\' 5"  (1.651 m)   Wt 61.2 kg   LMP 03/23/2018   SpO2 (!) 8%   BMI 22.47 kg/m   Physical Exam  Constitutional: She is oriented to person, place, and time. She appears well-developed and well-nourished.  HENT:  Head: Normocephalic and atraumatic.  Mouth/Throat: Posterior oropharyngeal erythema present.  Tender maxillary sinuses,  Eyes: Pupils are equal, round, and reactive to light. Conjunctivae and EOM are normal.  Neck: Normal range of motion. Neck supple.  Pulmonary/Chest: Effort normal.  Abdominal: Soft.  Musculoskeletal: Normal range of motion.  Neurological: She is alert and oriented to person, place, and time.  Skin: Skin is warm and dry.  Psychiatric: She has a normal mood and affect.  ED Treatments / Results  Labs (all labs ordered are listed, but only abnormal results are displayed) Labs Reviewed - No data to display  EKG None  Radiology No results found.  Procedures Procedures (including critical care time)  Medications Ordered in ED Medications - No data to display   Initial Impression / Assessment and Plan / ED Course  I have reviewed the triage vital signs and the nursing notes.  Pertinent labs & imaging results that were available during my care of the patient were reviewed by me and considered in my medical decision making (see chart for details).     MDM  Pt counseled on sinus disease.  Pt advised otc decongestants.   Final Clinical Impressions(s) / ED Diagnoses   Final diagnoses:  Acute non-recurrent maxillary sinusitis    ED Discharge Orders         Ordered    amoxicillin-clavulanate (AUGMENTIN) 875-125 MG tablet   Every 12 hours     04/04/18 1700        An After Visit Summary was printed and given to the patient.    Elson Areas, PA-C 04/04/18 1703    Melene Plan, DO 04/04/18 Merrily Brittle

## 2018-04-04 NOTE — ED Triage Notes (Signed)
Nasal congestion, sinus pressure and headache x 2 days.

## 2019-04-14 ENCOUNTER — Emergency Department (HOSPITAL_COMMUNITY)
Admission: EM | Admit: 2019-04-14 | Discharge: 2019-04-14 | Disposition: A | Discharge status: Home / Self Care | Payer: 59 | Attending: Emergency Medicine | Admitting: Emergency Medicine

## 2019-04-14 ENCOUNTER — Other Ambulatory Visit: Payer: Self-pay

## 2019-04-14 DIAGNOSIS — F17210 Nicotine dependence, cigarettes, uncomplicated: Secondary | ICD-10-CM | POA: Insufficient documentation

## 2019-04-14 DIAGNOSIS — K0889 Other specified disorders of teeth and supporting structures: Secondary | ICD-10-CM | POA: Diagnosis present

## 2019-04-14 DIAGNOSIS — K0530 Chronic periodontitis, unspecified: Secondary | ICD-10-CM | POA: Insufficient documentation

## 2019-04-14 DIAGNOSIS — Z79899 Other long term (current) drug therapy: Secondary | ICD-10-CM | POA: Insufficient documentation

## 2019-04-14 MED ORDER — AMOXICILLIN-POT CLAVULANATE 875-125 MG PO TABS: 1.0000 | ORAL_TABLET | Freq: Two times a day (BID) | ORAL | 0 refills | Status: AC

## 2019-04-14 MED ORDER — AMOXICILLIN-POT CLAVULANATE 875-125 MG PO TABS: 1.0000 | ORAL_TABLET | Freq: Two times a day (BID) | ORAL | 0 refills | Status: DC

## 2019-04-14 NOTE — Discharge Instructions (Addendum)
Please take the antibiotic as prescribed.  Continue ibuprofen as needed for inflammation and discomfort.  Very strict return precautions provided including but not limited to voice changes, respiratory difficulty, wheezing, inability to eat or drink, worsening swelling or discomfort, fevers or chills.   [facility]  [address]  [place],  [address]  Phone [phone]  [facility] [place], [place], exemplifies the [employer] vision to improve the health and quality of life of all [place] by Regulatory affairs officer with a passion to care for the underserved and by leading the nation in community-based, service learning oral health education. We are committed to offering comprehensive general dental services for adults, children and special needs patients in a safe, caring and professional setting.  Appointments: Our clinic is open Monday through Friday 8:00 a.m. until 5:00 p.m. The amount of time scheduled for an appointment depends on the patients specific needs. We ask that you keep your appointed time for care or provide 24-hour notice of all appointment changes. Parents or legal guardians must accompany minor children.  Payment for Services: Medicaid and other insurance plans are welcome. Payment for services is due when services are rendered and may be made by cash or credit card. If you have dental insurance, we will assist you with your claim submission.   Emergencies: Emergency services will be provided Monday through Friday on a walk-in basis. Please arrive early for emergency services. After hours emergency services will be provided for patients of record as required.  Services:  Marine scientist Dentistry  Oral Surgery - Extractions  Root Canals  Sealants and  Tooth Colored Fillings  Crowns and Bridges  Dentures and Partial Dentures  Implant Services  Periodontal Services and Cleanings  Cosmetic Statistician  3-D/[facility]

## 2019-04-14 NOTE — ED Triage Notes (Signed)
PT reports waking this morning with swelling to mainly the Lt side of face . Pt reports using ambasol  For dental pain on Sunday. Pt speaking in full sentences ,no resp distress notes on arrival to room.

## 2019-04-14 NOTE — ED Notes (Signed)
Patient verbalizes understanding of discharge instructions . Opportunity for questions and answers were provided . Armband removed by staff ,Pt discharged from ED. W/C  offered at D/C  and Declined W/C at D/C and was escorted to lobby by RN.  

## 2019-04-14 NOTE — ED Provider Notes (Signed)
[facility] EMERGENCY DEPARTMENT Provider Note   CSN: [id number] Arrival date & time: 04/14/19  1240     History   Chief Complaint Chief Complaint  Patient presents with  . Allergic Reaction    HPI Maureen Dawson is a 28 y.o. female with past medical history significant for dental abscess 3 years ago that compromised her airway requiring emergent intervention.  Today she presents to the ED accompanied by her husband with 2-day history of left upper molar and gum pain which she treated yesterday with Anbesol gel.  This morning she woke up with significant left-sided facial swelling and she was concerned for an allergic reaction.  She states that she took antihistamines this morning and that the swelling has subsided a bit, but is still quite obvious.  She still reports mild discomfort at rest, but denies any fevers, chills, trismus, inability eat or drink, or respiratory difficulty.     HPI  Past Medical History:  Diagnosis Date  . UTI (lower urinary tract infection)     There are no active problems to display for this patient.   Past Surgical History:  Procedure Laterality Date  . addenoidectomy    . CYST REMOVAL HAND    . TYMPANOSTOMY TUBE PLACEMENT       OB History    Gravida  1   Para      Term      Preterm      AB      Living        SAB      TAB      Ectopic      Multiple      Live Births               Home Medications    Prior to Admission medications   Medication Sig Start Date End Date Taking? Authorizing Provider  amoxicillin-clavulanate (AUGMENTIN) 875-125 MG tablet Take 1 tablet by mouth every 12 (twelve) hours for 10 days. 04/14/19 04/24/19  Corena Herter, PA-C  ibuprofen (ADVIL,MOTRIN) 200 MG tablet Take 800 mg by mouth every 6 (six) hours as needed for headache or moderate pain.    [provider]    Family History No family history on file.  Social History Social History   Tobacco Use  .  Smoking status: Current Every Day Smoker    Packs/day: 0.50    Types: Cigarettes  . Smokeless tobacco: Never Used  Substance Use Topics  . Alcohol use: Yes  . Drug use: No     Allergies   Patient has no known allergies.   Review of Systems Review of Systems  All other systems reviewed and are negative.    Physical Exam Updated Vital Signs BP (!) 148/99 (BP Location: Right Arm)   Pulse 85   Temp 98.7 F (37.1 C) (Oral)   Resp 20   Ht 5\' 5"  (1.651 m)   Wt 72.6 kg   LMP 03/26/2019   SpO2 99%   BMI 26.63 kg/m   Physical Exam Vitals signs and nursing note reviewed. Exam conducted with a chaperone present.  Constitutional:      Appearance: Normal appearance.  HENT:     Head: Normocephalic and atraumatic.     Mouth/Throat:     Comments: Poor dentition throughout the mouth with numerous caries.  No obvious areas of fluctuance or abscesses that require drainage.  Palpation in left upper molar gumline.  Pain with palpation left maxillary region beneath left  nare.  Oropharynx is patent, albeit anatomically small, with no uvular deviation.  No obvious masses appreciated. Eyes:     General: No scleral icterus.    Conjunctiva/sclera: Conjunctivae normal.  Neck:     Musculoskeletal: Normal range of motion and neck supple. No neck rigidity or muscular tenderness.     Comments: No masses appreciated. Cardiovascular:     Rate and Rhythm: Normal rate.  Pulmonary:     Effort: Pulmonary effort is normal.     Comments: No respiratory distress.  No wheezing.  No voice change. Skin:    General: Skin is dry.  Neurological:     Mental Status: She is alert.     GCS: GCS eye subscore is 4. GCS verbal subscore is 5. GCS motor subscore is 6.  Psychiatric:        Mood and Affect: Mood normal.        Behavior: Behavior normal.        Thought Content: Thought content normal.      ED Treatments / Results  Labs (all labs ordered are listed, but only abnormal results are displayed)  Labs Reviewed - No data to display  EKG None  Radiology No results found.  Procedures Procedures (including critical care time)  Medications Ordered in ED Medications - No data to display   Initial Impression / Assessment and Plan / ED Course  I have reviewed the triage vital signs and the nursing notes.  Pertinent labs & imaging results that were available during my care of the patient were reviewed by me and considered in my medical decision making (see chart for details).       Patient's presentation is consistent with periodontitis and gingivitis.  Patient has numerous areas of poor dentition throughout mouth and requires dental evaluation.  No evidence of an abscess at this time.  Patient denies any trismus and can eat and drink without difficulty.  Her pain is much improved from yesterday.  Only reports pain with palpation.  No uvular deviation concerning for PTA.  No masses or tenderness to palpation in neck appreciated.  Given unilateral facial swelling and evidence of periodontitis and gingivitis, will prescribe Augmentin to treat suspected bacterial infection.  Also will provide her with close follow-up with ED dentist on call today.  Continue to take ibuprofen as needed for inflammation and discomfort.  Very strict return precautions provided including but not limited to voice changes, respiratory difficulty, wheezing, inability to eat or drink, worsening swelling or discomfort, fevers or chills.    All of the evaluation and work-up results were discussed with the patient and any family at bedside. They were provided opportunity to ask any additional questions and have none at this time. They have expressed understanding of verbal discharge instructions as well as return precautions and are agreeable to the plan.    Final Clinical Impressions(s) / ED Diagnoses   Final diagnoses:  Periodontitis    ED Discharge Orders         Ordered    amoxicillin-clavulanate  (AUGMENTIN) 875-125 MG tablet  Every 12 hours     04/14/19 1449           Lorelee New, PA-C 04/14/19 1527    Pricilla Loveless, MD 04/15/19 [phone]

## 2020-12-25 DIAGNOSIS — S40022A Contusion of left upper arm, initial encounter: Secondary | ICD-10-CM | POA: Diagnosis not present

## 2020-12-25 DIAGNOSIS — S61411A Laceration without foreign body of right hand, initial encounter: Secondary | ICD-10-CM | POA: Diagnosis not present

## 2020-12-25 DIAGNOSIS — Y92009 Unspecified place in unspecified non-institutional (private) residence as the place of occurrence of the external cause: Secondary | ICD-10-CM | POA: Diagnosis not present

## 2020-12-25 DIAGNOSIS — Z23 Encounter for immunization: Secondary | ICD-10-CM | POA: Diagnosis not present

## 2020-12-25 DIAGNOSIS — S6992XA Unspecified injury of left wrist, hand and finger(s), initial encounter: Secondary | ICD-10-CM | POA: Diagnosis present

## 2020-12-25 DIAGNOSIS — F17210 Nicotine dependence, cigarettes, uncomplicated: Secondary | ICD-10-CM | POA: Insufficient documentation

## 2020-12-25 DIAGNOSIS — S0083XA Contusion of other part of head, initial encounter: Secondary | ICD-10-CM | POA: Insufficient documentation

## 2020-12-26 ENCOUNTER — Other Ambulatory Visit: Payer: Self-pay

## 2020-12-26 ENCOUNTER — Emergency Department (HOSPITAL_BASED_OUTPATIENT_CLINIC_OR_DEPARTMENT_OTHER): Payer: BC Managed Care – PPO

## 2020-12-26 ENCOUNTER — Emergency Department (HOSPITAL_COMMUNITY)
Admission: EM | Admit: 2020-12-26 | Discharge: 2020-12-26 | Disposition: A | Discharge status: Home / Self Care | Payer: BC Managed Care – PPO | Attending: Emergency Medicine | Admitting: Emergency Medicine

## 2020-12-26 ENCOUNTER — Encounter (HOSPITAL_COMMUNITY): Payer: Self-pay | Admitting: *Deleted

## 2020-12-26 ENCOUNTER — Emergency Department (HOSPITAL_BASED_OUTPATIENT_CLINIC_OR_DEPARTMENT_OTHER)
Admission: EM | Admit: 2020-12-26 | Discharge: 2020-12-26 | Disposition: A | Discharge status: Home / Self Care | Payer: BC Managed Care – PPO | Source: Home / Self Care | Attending: Emergency Medicine | Admitting: Emergency Medicine

## 2020-12-26 ENCOUNTER — Encounter (HOSPITAL_BASED_OUTPATIENT_CLINIC_OR_DEPARTMENT_OTHER): Payer: Self-pay | Admitting: Emergency Medicine

## 2020-12-26 DIAGNOSIS — S40022A Contusion of left upper arm, initial encounter: Secondary | ICD-10-CM | POA: Insufficient documentation

## 2020-12-26 DIAGNOSIS — S61411A Laceration without foreign body of right hand, initial encounter: Secondary | ICD-10-CM | POA: Insufficient documentation

## 2020-12-26 DIAGNOSIS — Y92009 Unspecified place in unspecified non-institutional (private) residence as the place of occurrence of the external cause: Secondary | ICD-10-CM | POA: Insufficient documentation

## 2020-12-26 DIAGNOSIS — F17210 Nicotine dependence, cigarettes, uncomplicated: Secondary | ICD-10-CM | POA: Insufficient documentation

## 2020-12-26 DIAGNOSIS — Z23 Encounter for immunization: Secondary | ICD-10-CM | POA: Insufficient documentation

## 2020-12-26 DIAGNOSIS — S0083XA Contusion of other part of head, initial encounter: Secondary | ICD-10-CM | POA: Insufficient documentation

## 2020-12-26 MED ORDER — ACETAMINOPHEN 500 MG PO TABS
1000.0000 mg | ORAL_TABLET | Freq: Once | ORAL | Status: AC
  Administered 2020-12-26: 1000 mg via ORAL
  Filled 2020-12-26: qty 2

## 2020-12-26 MED ORDER — IBUPROFEN 800 MG PO TABS: 800.0000 mg | ORAL_TABLET | Freq: Once | ORAL | Status: DC

## 2020-12-26 MED ORDER — LIDOCAINE HCL (PF) 1 % IJ SOLN
5.0000 mL | Freq: Once | INTRAMUSCULAR | Status: AC
  Administered 2020-12-26: 5 mL
  Filled 2020-12-26: qty 5

## 2020-12-26 MED ORDER — TETANUS-DIPHTH-ACELL PERTUSSIS 5-2.5-18.5 LF-MCG/0.5 IM SUSY: 0.5000 mL | PREFILLED_SYRINGE | Freq: Once | INTRAMUSCULAR | Status: DC

## 2020-12-26 MED ORDER — TETANUS-DIPHTH-ACELL PERTUSSIS 5-2.5-18.5 LF-MCG/0.5 IM SUSY
0.5000 mL | PREFILLED_SYRINGE | Freq: Once | INTRAMUSCULAR | Status: AC
  Administered 2020-12-26: 0.5 mL via INTRAMUSCULAR
  Filled 2020-12-26: qty 0.5
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
## 2020-12-26 NOTE — Discharge Instructions (Addendum)
You were evaluated in the Emergency Department and after careful evaluation, we did not find any emergent condition requiring admission or further testing in the hospital.  Your exam/testing today was overall reassuring.  The bruising on your face and arms should improve with time.  Recommend Tylenol or Motrin at home for your soreness.  Your stitches will need to be removed in 7 to 10 days by healthcare professional.  Please return to the Emergency Department if you experience any worsening of your condition.  Thank you for allowing [place] to be a part of your care.
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
## 2020-12-26 NOTE — ED Notes (Signed)
Pt called 2x no answer 
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
## 2021-01-02 ENCOUNTER — Emergency Department (HOSPITAL_BASED_OUTPATIENT_CLINIC_OR_DEPARTMENT_OTHER)
Admission: EM | Admit: 2021-01-02 | Discharge: 2021-01-02 | Disposition: A | Discharge status: Home / Self Care | Payer: BC Managed Care – PPO | Attending: Emergency Medicine | Admitting: Emergency Medicine

## 2021-01-02 ENCOUNTER — Encounter (HOSPITAL_BASED_OUTPATIENT_CLINIC_OR_DEPARTMENT_OTHER): Payer: Self-pay | Admitting: *Deleted

## 2021-01-02 ENCOUNTER — Other Ambulatory Visit: Payer: Self-pay

## 2021-01-02 DIAGNOSIS — Z4802 Encounter for removal of sutures: Secondary | ICD-10-CM

## 2021-01-02 DIAGNOSIS — X58XXXD Exposure to other specified factors, subsequent encounter: Secondary | ICD-10-CM | POA: Insufficient documentation

## 2021-01-02 DIAGNOSIS — F17210 Nicotine dependence, cigarettes, uncomplicated: Secondary | ICD-10-CM | POA: Diagnosis not present

## 2021-01-02 DIAGNOSIS — S61501D Unspecified open wound of right wrist, subsequent encounter: Secondary | ICD-10-CM | POA: Insufficient documentation

## 2021-01-02 NOTE — ED Notes (Signed)
Pt here for suture removal, sutures placed 1 week ago.

## 2021-01-02 NOTE — ED Provider Notes (Signed)
[facility] EMERGENCY DEPARTMENT Provider Note  CSN: [id number] Arrival date & time: 01/02/21 1411    History Chief Complaint  Patient presents with   Suture / Staple Removal    Maureen Dawson is a 30 y.o. female here for suture removal. Had two sutures placed in R wrist about a week ago. No signs of infection.    Past Medical History:  Diagnosis Date   UTI (lower urinary tract infection)     Past Surgical History:  Procedure Laterality Date   addenoidectomy     CYST REMOVAL HAND     TONSILLECTOMY     TYMPANOSTOMY TUBE PLACEMENT      History reviewed. No pertinent family history.  Social History   Tobacco Use   Smoking status: Every Day    Packs/day: 0.50    Pack years: 0.00    Types: Cigarettes   Smokeless tobacco: Never  Substance Use Topics   Alcohol use: Yes   Drug use: No     Home Medications Prior to Admission medications   Medication Sig Start Date End Date Taking? Authorizing Provider  ibuprofen (ADVIL,MOTRIN) 200 MG tablet Take 800 mg by mouth every 6 (six) hours as needed for headache or moderate pain.    [provider]     Allergies    Patient has no known allergies.   Review of Systems   Review of Systems  Skin:  Positive for wound. Negative for color change.    Physical Exam BP (!) 138/102 (BP Location: Left Arm)   Pulse 93   Temp 98.9 F (37.2 C) (Oral)   Resp 18   Ht 5\' 5"  (1.651 m)   Wt 72.6 kg   LMP 12/25/2020   SpO2 97%   BMI 26.63 kg/m   Physical Exam Vitals and nursing note reviewed.  HENT:     Head: Normocephalic.     Nose: Nose normal.  Eyes:     Extraocular Movements: Extraocular movements intact.  Pulmonary:     Effort: Pulmonary effort is normal.  Musculoskeletal:        General: Normal range of motion.     Cervical back: Neck supple.  Skin:    Findings: No rash (on exposed skin).     Comments: Wound on R wrist is clean dry and intact without signs of infection  Neurological:      Mental Status: She is alert and oriented to person, place, and time.  Psychiatric:        Mood and Affect: Mood normal.     ED Results / Procedures / Treatments   Labs (all labs ordered are listed, but only abnormal results are displayed) Labs Reviewed - No data to display  EKG None   Radiology No results found.  Procedures .Suture Removal  Date/Time: 01/02/2021 2:36 PM Performed by: 01/04/2021, MD Authorized by: Pollyann Savoy, MD   Consent:    Consent obtained:  Verbal   Consent given by:  Patient Location:    Location:  Upper extremity   Upper extremity location:  Wrist   Wrist location:  R wrist Procedure details:    Wound appearance:  No signs of infection   Number of sutures removed:  2 Post-procedure details:    Post-removal:  Band-Aid applied   Procedure completion:  Tolerated well, no immediate complications  Medications Ordered in the ED Medications - No data to display   MDM Rules/Calculators/A&P MDM   ED Course  I have reviewed the  triage vital signs and the nursing notes.  Pertinent labs & imaging results that were available during my care of the patient were reviewed by me and considered in my medical decision making (see chart for details).     Final Clinical Impression(s) / ED Diagnoses Final diagnoses:  Visit for suture removal    Rx / DC Orders ED Discharge Orders     None        Pollyann Savoy, MD 01/02/21 1437

## 2021-01-02 NOTE — ED Triage Notes (Signed)
Pt for suture removal. 2 sutures in right wrist.
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
